# Patient Record
Sex: Female | Born: 1990 | Hispanic: No | Marital: Single | Smoking: Never smoker
Health system: Southern US, Community
[De-identification: ages and names within clinical notes are randomized; demographics above are authoritative.]

## PROBLEM LIST (undated history)

## (undated) DIAGNOSIS — F32A Depression, unspecified: Secondary | ICD-10-CM

## (undated) DIAGNOSIS — F419 Anxiety disorder, unspecified: Secondary | ICD-10-CM

## (undated) DIAGNOSIS — G43909 Migraine, unspecified, not intractable, without status migrainosus: Secondary | ICD-10-CM

## (undated) DIAGNOSIS — F329 Major depressive disorder, single episode, unspecified: Secondary | ICD-10-CM

---

## 2015-08-01 ENCOUNTER — Emergency Department (INDEPENDENT_AMBULATORY_CARE_PROVIDER_SITE_OTHER)
Admission: EM | Admit: 2015-08-01 | Discharge: 2015-08-01 | Disposition: A | Discharge status: Home / Self Care | Payer: BLUE CROSS/BLUE SHIELD | Source: Home / Self Care | Attending: Family Medicine | Admitting: Family Medicine

## 2015-08-01 ENCOUNTER — Encounter (HOSPITAL_COMMUNITY): Payer: Self-pay | Admitting: Emergency Medicine

## 2015-08-01 DIAGNOSIS — J069 Acute upper respiratory infection, unspecified: Secondary | ICD-10-CM | POA: Diagnosis not present

## 2015-08-01 HISTORY — DX: Migraine, unspecified, not intractable, without status migrainosus: G43.909

## 2015-08-01 MED ORDER — IPRATROPIUM BROMIDE 0.06 % NA SOLN: 2.0000 | Freq: Four times a day (QID) | NASAL | Status: AC

## 2015-08-01 NOTE — ED Notes (Signed)
Productive cough, runny nose, sore throat. PT denies fever and body aches.   Taken mucinex and cold/flu OTC with no relief.

## 2015-08-01 NOTE — ED Provider Notes (Signed)
CSN: [id number]     Arrival date & time 08/01/15  1321 History   First MD Initiated Contact with Patient 08/01/15 1423     Chief Complaint  Patient presents with  . Cough   (Consider location/radiation/quality/duration/timing/severity/associated sxs/prior Treatment) Patient is a 25 y.o. female presenting with cough. The history is provided by the patient.  Cough Cough characteristics:  Productive and hacking Sputum characteristics:  Yellow Severity:  Mild Onset quality:  Gradual Duration:  1 week Progression:  Unchanged Chronicity:  New Smoker: no   Context: upper respiratory infection   Ineffective treatments:  Cough suppressants Associated symptoms: rhinorrhea and sinus congestion   Associated symptoms: no chills, no fever, no myalgias, no shortness of breath, no sore throat and no wheezing     Past Medical History  Diagnosis Date  . Migraines    History reviewed. No pertinent past surgical history. History reviewed. No pertinent family history. Social History  Substance Use Topics  . Smoking status: Never Smoker   . Smokeless tobacco: None  . Alcohol Use: Yes     Comment: occasional   OB History    Obstetric Comments   LMP 2 weeks ago     Review of Systems  Constitutional: Negative.  Negative for fever and chills.  HENT: Positive for congestion, postnasal drip and rhinorrhea. Negative for sore throat.   Respiratory: Positive for cough. Negative for shortness of breath and wheezing.   Musculoskeletal: Negative for myalgias.  All other systems reviewed and are negative.   Allergies  Review of patient's allergies indicates no known allergies.  Home Medications   Prior to Admission medications   Medication Sig Start Date End Date Taking? Authorizing Provider  buPROPion (WELLBUTRIN) 100 MG tablet Take 25 mg by mouth daily.   Yes Historical Provider, MD  ipratropium (ATROVENT) 0.06 % nasal spray Place 2 sprays into both nostrils 4 (four) times daily. 08/01/15    Linna HoffJames D Libero Puthoff, MD   Meds Ordered and Administered this Visit  Medications - No data to display  BP 145/85 mmHg  Pulse 88  Temp(Src) 98.4 F (36.9 C) (Oral)  Resp 18  Ht 5\' 3"  (1.6 m)  Wt 120 lb (54.432 kg)  BMI 21.26 kg/m2  SpO2 100%  LMP 07/18/2015 No data found.   Physical Exam  Constitutional: She is oriented to person, place, and time. She appears well-developed and well-nourished.  HENT:  Right Ear: External ear normal.  Left Ear: External ear normal.  Mouth/Throat: Oropharynx is clear and moist.  Eyes: Pupils are equal, round, and reactive to light.  Neck: Normal range of motion. Neck supple.  Cardiovascular: Normal heart sounds.   Pulmonary/Chest: Effort normal and breath sounds normal.  Lymphadenopathy:    She has no cervical adenopathy.  Neurological: She is alert and oriented to person, place, and time.  Skin: Skin is warm and dry.  Nursing note and vitals reviewed.   ED Course  Procedures (including critical care time)  Labs Review Labs Reviewed - No data to display  Imaging Review No results found.   Visual Acuity Review  Right Eye Distance:   Left Eye Distance:   Bilateral Distance:    Right Eye Near:   Left Eye Near:    Bilateral Near:         MDM   1. URI (upper respiratory infection)        Linna HoffJames D Nyiah Pianka, MD 08/01/15 [phone]

## 2015-08-01 NOTE — Discharge Instructions (Signed)
Drink plenty of fluids as discussed, use medicine as prescribed, and mucinex or delsym for cough. Return or see your doctor if further problems °

## 2015-10-10 ENCOUNTER — Ambulatory Visit: Payer: BLUE CROSS/BLUE SHIELD | Admitting: Licensed Clinical Social Worker

## 2016-08-31 ENCOUNTER — Encounter (HOSPITAL_COMMUNITY): Payer: Self-pay | Admitting: Emergency Medicine

## 2016-08-31 ENCOUNTER — Emergency Department (HOSPITAL_COMMUNITY)
Admission: EM | Admit: 2016-08-31 | Discharge: 2016-08-31 | Disposition: A | Discharge status: Home / Self Care | Payer: BLUE CROSS/BLUE SHIELD | Attending: Emergency Medicine | Admitting: Emergency Medicine

## 2016-08-31 DIAGNOSIS — G43909 Migraine, unspecified, not intractable, without status migrainosus: Secondary | ICD-10-CM | POA: Diagnosis present

## 2016-08-31 DIAGNOSIS — G43809 Other migraine, not intractable, without status migrainosus: Secondary | ICD-10-CM | POA: Insufficient documentation

## 2016-08-31 HISTORY — DX: Anxiety disorder, unspecified: F41.9

## 2016-08-31 HISTORY — DX: Major depressive disorder, single episode, unspecified: F32.9

## 2016-08-31 HISTORY — DX: Depression, unspecified: F32.A

## 2016-08-31 MED ORDER — PROCHLORPERAZINE MALEATE 5 MG PO TABS
10.0000 mg | ORAL_TABLET | Freq: Once | ORAL | Status: AC
  Administered 2016-08-31: 10 mg via ORAL
  Filled 2016-08-31: qty 2

## 2016-08-31 MED ORDER — ACETAMINOPHEN 325 MG PO TABS
650.0000 mg | ORAL_TABLET | Freq: Once | ORAL | Status: AC
  Administered 2016-08-31: 650 mg via ORAL
  Filled 2016-08-31: qty 2

## 2016-08-31 MED ORDER — IBUPROFEN 800 MG PO TABS
800.0000 mg | ORAL_TABLET | Freq: Once | ORAL | Status: AC
  Administered 2016-08-31: 800 mg via ORAL
  Filled 2016-08-31: qty 1

## 2016-08-31 NOTE — ED Provider Notes (Signed)
[facility]-EMERGENCY DEPT Provider Note   CSN: [id number] Arrival date & time: 08/31/16  0554     History   Chief Complaint Chief Complaint  Patient presents with  . Migraine    HPI Diana Hoover is a 26 y.o. female.  HPI  26 year old female presents today complaining of migraine headache for 22 hours. She states that his is diffuse in nature. She has had some nausea but no vomiting. It is somewhat similar to previous migraine headaches which was scribed by Leodis Liverpool every week but has not had one for several months. She normally takes acetaminophen and ibuprofen. She took DayQuil last night. This morning she continues to have a headache. She has not taken anything today. She has also had some URI symptoms and some body aches. She has not had fever, chills, vomiting, or diarrhea. She began her menstrual cycle yesterday. She works at [employer] and did not work last night.  Past Medical History:  Diagnosis Date  . Anxiety   . Depression   . Migraines     There are no active problems to display for this patient.   History reviewed. No pertinent surgical history.  OB History    Obstetric Comments   LMP 2 weeks ago       Home Medications    Prior to Admission medications   Medication Sig Start Date End Date Taking? Authorizing Provider  DM-Phenylephrine-Acetaminophen (VICKS DAYQUIL COLD & FLU) 10-5-325 MG/15ML LIQD Take 15 mLs by mouth as needed (for coug).   Yes Historical Provider, MD  ipratropium (ATROVENT) 0.06 % nasal spray Place 2 sprays into both nostrils 4 (four) times daily. Patient not taking: Reported on 08/31/2016 08/01/15   Linna Hoff, MD    Family History No family history on file.  Social History Social History  Substance Use Topics  . Smoking status: Never Smoker  . Smokeless tobacco: Never Used  . Alcohol use Yes     Comment: occasional     Allergies   Patient has no known allergies.   Review of Systems Review of Systems  Constitutional:  Negative for chills and fever.  HENT: Positive for congestion.   Eyes: Negative.   Respiratory: Negative.   Cardiovascular: Negative.   Gastrointestinal: Negative.   Endocrine: Negative.   Genitourinary: Negative.   Musculoskeletal: Negative.   Neurological: Positive for headaches.  Hematological: Negative.   Psychiatric/Behavioral: Negative.   All other systems reviewed and are negative.    Physical Exam Updated Vital Signs BP 121/83 (BP Location: Right Arm)   Pulse (!) 115   Temp 99.9 F (37.7 C) (Oral)   Resp 18   Ht  (1.6 m)   Wt 54.4 kg   LMP 08/30/2016 (Exact Date)   SpO2 100%   BMI 21.26 kg/m   Physical Exam  Constitutional: She is oriented to person, place, and time. She appears well-developed and well-nourished. No distress.  HENT:  Head: Normocephalic and atraumatic.  Right Ear: External ear normal.  Left Ear: External ear normal.  Nose: Nose normal.  Eyes: Conjunctivae and EOM are normal. Pupils are equal, round, and reactive to light.  Neck: Normal range of motion. Neck supple.  Cardiovascular: Normal rate.   Pulmonary/Chest: Effort normal.  Abdominal: Soft.  Musculoskeletal: Normal range of motion.  Neurological: She is alert and oriented to person, place, and time. No cranial nerve deficit. She exhibits normal muscle tone. Coordination normal.  Skin: Skin is warm and dry.  Psychiatric: She has a normal mood  and affect. Her behavior is normal. Thought content normal.  Nursing note and vitals reviewed.    ED Treatments / Results  Labs (all labs ordered are listed, but only abnormal results are displayed) Labs Reviewed - No data to display  EKG  EKG Interpretation None       Radiology No results found.  Procedures Procedures (including critical care time)  Medications Ordered in ED Medications  ibuprofen (ADVIL,MOTRIN) tablet 800 mg (not administered)  acetaminophen (TYLENOL) tablet 650 mg (not administered)  prochlorperazine  (COMPAZINE) tablet 10 mg (not administered)     Initial Impression / Assessment and Plan / ED Course  I have reviewed the triage vital signs and the nursing notes.  Pertinent labs & imaging results that were available during my care of the patient were reviewed by me and considered in my medical decision making (see chart for details).      Final Clinical Impressions(s) / ED Diagnoses   Final diagnoses:  Other migraine without status migrainosus, not intractable    New Prescriptions New Prescriptions   No medications on file     Margarita Grizzle, MD 08/31/16 [phone]

## 2016-08-31 NOTE — ED Triage Notes (Signed)
Pt reports a migraine going on for 21 hours now. Pt reports a history of same but does not have any prescription medications. Pt reports this migraine is different from the others in that she normally gets pain behind her right eye but this one the pain is all over her head.

## 2017-05-02 ENCOUNTER — Emergency Department (HOSPITAL_COMMUNITY)
Admission: EM | Admit: 2017-05-02 | Discharge: 2017-05-02 | Disposition: A | Discharge status: Home / Self Care | Payer: BLUE CROSS/BLUE SHIELD | Attending: Emergency Medicine | Admitting: Emergency Medicine

## 2017-05-02 ENCOUNTER — Encounter (HOSPITAL_COMMUNITY): Payer: Self-pay | Admitting: Emergency Medicine

## 2017-05-02 DIAGNOSIS — G43009 Migraine without aura, not intractable, without status migrainosus: Secondary | ICD-10-CM

## 2017-05-02 DIAGNOSIS — G43909 Migraine, unspecified, not intractable, without status migrainosus: Secondary | ICD-10-CM | POA: Insufficient documentation

## 2017-05-02 MED ORDER — KETOROLAC TROMETHAMINE 30 MG/ML IJ SOLN
30.0000 mg | Freq: Once | INTRAMUSCULAR | Status: AC
  Administered 2017-05-02: 30 mg via INTRAMUSCULAR
  Filled 2017-05-02: qty 1

## 2017-05-02 MED ORDER — DIPHENHYDRAMINE HCL 25 MG PO CAPS
25.0000 mg | ORAL_CAPSULE | Freq: Once | ORAL | Status: AC
  Administered 2017-05-02: 25 mg via ORAL
  Filled 2017-05-02: qty 1

## 2017-05-02 MED ORDER — METOCLOPRAMIDE HCL 10 MG PO TABS
5.0000 mg | ORAL_TABLET | Freq: Once | ORAL | Status: AC
  Administered 2017-05-02: 5 mg via ORAL
  Filled 2017-05-02: qty 1

## 2017-05-02 MED ORDER — KETOROLAC TROMETHAMINE 10 MG PO TABS: 10.0000 mg | ORAL_TABLET | Freq: Four times a day (QID) | ORAL | 0 refills | Status: AC | PRN

## 2017-05-02 NOTE — ED Provider Notes (Signed)
[facility]-EMERGENCY DEPT Provider Note   CSN: [id number] Arrival date & time: 05/02/17  1456     History   Chief Complaint Chief Complaint  Patient presents with  . Migraine    HPI Diana Hoover is a 26 y.o. female.  HPI   26 year old female presents today with complaints of migraine.  Patient has a significant past medical history of the same.  She notes last night developing a pain in her right forehead and behind her eye.  This radiates back to her neck.  She reports this is identical to previous migraines, not improved with over-the-counter medication including Excedrin.  Patient denies any neurological deficits.  Patient reports she has had upper respiratory congestion over the last week and thinks that this has triggered it.  She denies any fever, cough, shortness of breath, any signs of serious infection.  Patient denies any other complaints today.   Past Medical History:  Diagnosis Date  . Anxiety   . Depression   . Migraines     There are no active problems to display for this patient.   History reviewed. No pertinent surgical history.  OB History    Obstetric Comments   LMP 2 weeks ago       Home Medications    Prior to Admission medications   Medication Sig Start Date End Date Taking? Authorizing Provider  DM-Phenylephrine-Acetaminophen (VICKS DAYQUIL COLD & FLU) 10-5-325 MG/15ML LIQD Take 15 mLs by mouth as needed (for coug).    [provider]  ipratropium (ATROVENT) 0.06 % nasal spray Place 2 sprays into both nostrils 4 (four) times daily. Patient not taking: Reported on 08/31/2016 08/01/15   Linna HoffKindl, James D, MD  ketorolac (TORADOL) 10 MG tablet Take 1 tablet (10 mg total) by mouth every 6 (six) hours as needed. 05/02/17   Eyvonne MechanicHedges, Luanna Weesner, PA-C    Family History No family history on file.  Social History Social History   Tobacco Use  . Smoking status: Never Smoker  . Smokeless tobacco: Never Used  Substance Use  Topics  . Alcohol use: Yes    Comment: occasional  . Drug use: No     Allergies   Patient has no known allergies.   Review of Systems Review of Systems  All other systems reviewed and are negative.    Physical Exam Updated Vital Signs BP 128/82 (BP Location: Left Arm)   Pulse 97   Resp 14   SpO2 100%   Physical Exam  Constitutional: She is oriented to person, place, and time. She appears well-developed and well-nourished.  HENT:  Head: Normocephalic and atraumatic.  Eyes: Conjunctivae are normal. Pupils are equal, round, and reactive to light. Right eye exhibits no discharge. Left eye exhibits no discharge. No scleral icterus.  Neck: Normal range of motion. No JVD present. No tracheal deviation present.  Pulmonary/Chest: Effort normal. No stridor.  Neurological: She is alert and oriented to person, place, and time. No cranial nerve deficit or sensory deficit. She exhibits normal muscle tone. Coordination normal.  Psychiatric: She has a normal mood and affect. Her behavior is normal. Judgment and thought content normal.  Nursing note and vitals reviewed.    ED Treatments / Results  Labs (all labs ordered are listed, but only abnormal results are displayed) Labs Reviewed - No data to display  EKG  EKG Interpretation None       Radiology No results found.  Procedures Procedures (including critical care time)  Medications Ordered in ED  Medications  ketorolac (TORADOL) 30 MG/ML injection 30 mg (30 mg Intramuscular Given 05/02/17 1828)  metoCLOPramide (REGLAN) tablet 5 mg (5 mg Oral Given 05/02/17 1827)  diphenhydrAMINE (BENADRYL) capsule 25 mg (25 mg Oral Given 05/02/17 1827)     Initial Impression / Assessment and Plan / ED Course  I have reviewed the triage vital signs and the nursing notes.  Pertinent labs & imaging results that were available during my care of the patient were reviewed by me and considered in my medical decision making (see chart for  details).      Final Clinical Impressions(s) / ED Diagnoses   Final diagnoses:  Migraine without aura and without status migrainosus, not intractable    Labs:   Imaging:  Consults:  Therapeutics: Toradol, Reglan, Benadryl  Discharge Meds: toradol   Assessment/Plan: 26 year old female presents today with complaints of migraine.  Patient without neurological deficits, symptoms improved with Toradol Reglan Benadryl.. Migraine typical of previous.  Patient will be given a short course of Toradol as needed for her migraines.  She is instructed to follow-up with neurology, return with any new or worsening signs or symptoms.  Patient without history of GI bleed or any other past medical history.  Patient verbalized understanding and agreement to today's plan had no further questions or concerns at the time of discharge.      ED Discharge Orders        Ordered    ketorolac (TORADOL) 10 MG tablet  Every 6 hours PRN     05/02/17 1930       Rosalio LoudHedges, Kaleena Corrow, PA-C 05/02/17 1932    Bethann BerkshireZammit, Joseph, MD 05/02/17 2242

## 2017-05-02 NOTE — ED Triage Notes (Signed)
Patient here with complaints of migraine that started 16 hours ago. Nausea, no vomiting. OTC medications with no relief. Hx of same.

## 2017-05-02 NOTE — Discharge Instructions (Signed)
Please read attached information. If you experience any new or worsening signs or symptoms please return to the emergency room for evaluation. Please follow-up with your primary care provider or specialist as discussed. Please use medication prescribed only as directed and discontinue taking if you have any concerning signs or symptoms.   °

## 2020-02-26 ENCOUNTER — Other Ambulatory Visit: Payer: Self-pay

## 2020-02-26 ENCOUNTER — Emergency Department (HOSPITAL_COMMUNITY)
Admission: EM | Admit: 2020-02-26 | Discharge: 2020-02-26 | Disposition: A | Discharge status: Home / Self Care | Payer: BC Managed Care – PPO | Attending: Emergency Medicine | Admitting: Emergency Medicine

## 2020-02-26 ENCOUNTER — Encounter (HOSPITAL_COMMUNITY): Payer: Self-pay

## 2020-02-26 ENCOUNTER — Emergency Department (HOSPITAL_COMMUNITY): Payer: BC Managed Care – PPO

## 2020-02-26 DIAGNOSIS — W01198A Fall on same level from slipping, tripping and stumbling with subsequent striking against other object, initial encounter: Secondary | ICD-10-CM | POA: Insufficient documentation

## 2020-02-26 DIAGNOSIS — Y92009 Unspecified place in unspecified non-institutional (private) residence as the place of occurrence of the external cause: Secondary | ICD-10-CM | POA: Insufficient documentation

## 2020-02-26 DIAGNOSIS — S0992XA Unspecified injury of nose, initial encounter: Secondary | ICD-10-CM

## 2020-02-26 NOTE — Discharge Instructions (Signed)
Take Tylenol and/or ibuprofen as needed for pain. Use ice to with pain and swelling. Try not to touch or pick at oyur nose, this may case recurrent bleeding. Follow-up with the ear nose and throat doctor listed below as needed for further evaluation. Return to the emergency room if you develop severe worsening headache, vision changes, slurred speech, vomiting, difficulty walking straight line, new, worsening, or concerning symptoms.

## 2020-02-26 NOTE — ED Triage Notes (Signed)
Patient complains of nose pain after tripping today and falling on face and striking nose. Patient denies loc. Bleeding intermittently through left nare, swelling noted to nose. Patient in no distress

## 2020-02-26 NOTE — ED Notes (Signed)
The pt is c/o a possible broken nose  She tripped over her baggy pajamas this am and she fell face forward onto her vinyl floor  Striking her nose.  It bled initially small scratch to her nose  No bleeding at present aand o x 4

## 2020-02-26 NOTE — ED Provider Notes (Signed)
[facility] EMERGENCY DEPARTMENT Provider Note   CSN: [id number] Arrival date & time: 02/26/20  1416     History No chief complaint on file.   Diana Hoover is a 29 y.o. female presenting for evaluation of nose pain and swelling.  Patient states around 2:00 this afternoon she fell when her pants got caught on something in the room, falling face first and hitting her head on the floor.  Since then, she has had pain mostly in her nose and her forehead.  She denies loss of consciousness.  She did have bleeding from her nose, but this resolved about an hour ago.  It resolved after direct pressure.  She has not taken anything for pain including Tylenol or ibuprofen.  She denies headache, vision changes, slurred speech, neck pain, back pain, numbness, tingling.  She has no medical problems, takes medications daily.  She is not on blood thinners.  HPI     Past Medical History:  Diagnosis Date  . Anxiety   . Depression   . Migraines     There are no problems to display for this patient.   History reviewed. No pertinent surgical history.   OB History   No obstetric history on file.    Obstetric Comments  LMP 2 weeks ago        No family history on file.  Social History   Tobacco Use  . Smoking status: Never Smoker  . Smokeless tobacco: Never Used  Substance Use Topics  . Alcohol use: Yes    Comment: occasional  . Drug use: No    Home Medications Prior to Admission medications   Medication Sig Start Date End Date Taking? Authorizing Provider  DM-Phenylephrine-Acetaminophen (VICKS DAYQUIL COLD & FLU) 10-5-325 MG/15ML LIQD Take 15 mLs by mouth as needed (for coug).    [provider]  ipratropium (ATROVENT) 0.06 % nasal spray Place 2 sprays into both nostrils 4 (four) times daily. Patient not taking: Reported on 08/31/2016 08/01/15   Linna Hoff, MD  ketorolac (TORADOL) 10 MG tablet Take 1 tablet (10 mg total) by mouth every 6 (six) hours as  needed. 05/02/17   Eyvonne Mechanic, PA-C    Allergies    Patient has no known allergies.  Review of Systems   Review of Systems  HENT: Positive for facial swelling (nose) and nosebleeds (resolved).   Neurological: Positive for headaches (frontal).  All other systems reviewed and are negative.   Physical Exam Updated Vital Signs BP 125/82   Pulse 88   Temp 98.5 F (36.9 C) (Oral)   Resp 18   SpO2 100%   Physical Exam Vitals and nursing note reviewed.  Constitutional:      General: She is not in acute distress.    Appearance: She is well-developed.     Comments: Sitting in the bed in no acute distress  HENT:     Head: Normocephalic.     Comments: Swelling of the bridge of the nose.  No nasal septal hematoma.  No trismus or malocclusion.  No tenderness palpation of the frontal head, no hematoma of the skull.  No hemotympanum. Eyes:     Conjunctiva/sclera: Conjunctivae normal.     Pupils: Pupils are equal, round, and reactive to light.  Neck:     Comments: No TTP of midline C-spine.  No step-offs or deformities.  Full active range of motion of neck without pain. Cardiovascular:     Rate and Rhythm: Normal rate and regular  rhythm.  Pulmonary:     Effort: Pulmonary effort is normal. No respiratory distress.     Breath sounds: Normal breath sounds. No wheezing.  Abdominal:     General: Bowel sounds are normal. There is no distension.     Palpations: Abdomen is soft.     Tenderness: There is no abdominal tenderness.  Musculoskeletal:        General: Normal range of motion.     Cervical back: Normal range of motion and neck supple.  Skin:    General: Skin is warm and dry.     Capillary Refill: Capillary refill takes less than 2 seconds.  Neurological:     Mental Status: She is alert and oriented to person, place, and time.     ED Results / Procedures / Treatments   Labs (all labs ordered are listed, but only abnormal results are displayed) Labs Reviewed - No data to  display  EKG None  Radiology DG Nasal Bones  Result Date: 02/26/2020 CLINICAL DATA:  Status post fall. EXAM: NASAL BONES - 3+ VIEW COMPARISON:  None. FINDINGS: There is no evidence of fracture or other bone abnormality. IMPRESSION: Negative. Electronically Signed   By: Aram Candela M.D.   On: 02/26/2020 15:20    Procedures Procedures (including critical care time)  Medications Ordered in ED Medications - No data to display  ED Course  I have reviewed the triage vital signs and the nursing notes.  Pertinent labs & imaging results that were available during my care of the patient were reviewed by me and considered in my medical decision making (see chart for details).    MDM Rules/Calculators/A&P                          Patient resenting for evaluation of nose pain and swelling after falling.  On exam, patient appears nontoxic.  X-rays obtained from triage read interpreted by me, no obvious fracture, however clinically patient has high risk for fracture.  Discussed treatment with pain control and ice, follow-up with ENT as needed.  Offered head CT due to frontal head pain, patient declined.  As I have low suspicion for intracranial bleed or skull fracture, I am agreeable with holding off on CT.  Discussed strict return precautions.  At this time, patient appears safe for discharge.  Patient states she understands and agrees to plan.   Final Clinical Impression(s) / ED Diagnoses Final diagnoses:  Injury of nose, initial encounter    Rx / DC Orders ED Discharge Orders    None       Alveria Apley, PA-C 02/26/20 1736    Virgina Norfolk, DO 02/26/20 [phone]

## 2021-04-22 IMAGING — CR DG NASAL BONES 3+V
3 series · 3 of 3 positions shown · non-contrast
Comparison: None.

CLINICAL DATA: Status post fall.

EXAM:
NASAL BONES - 3+ VIEW

[nasal waters]
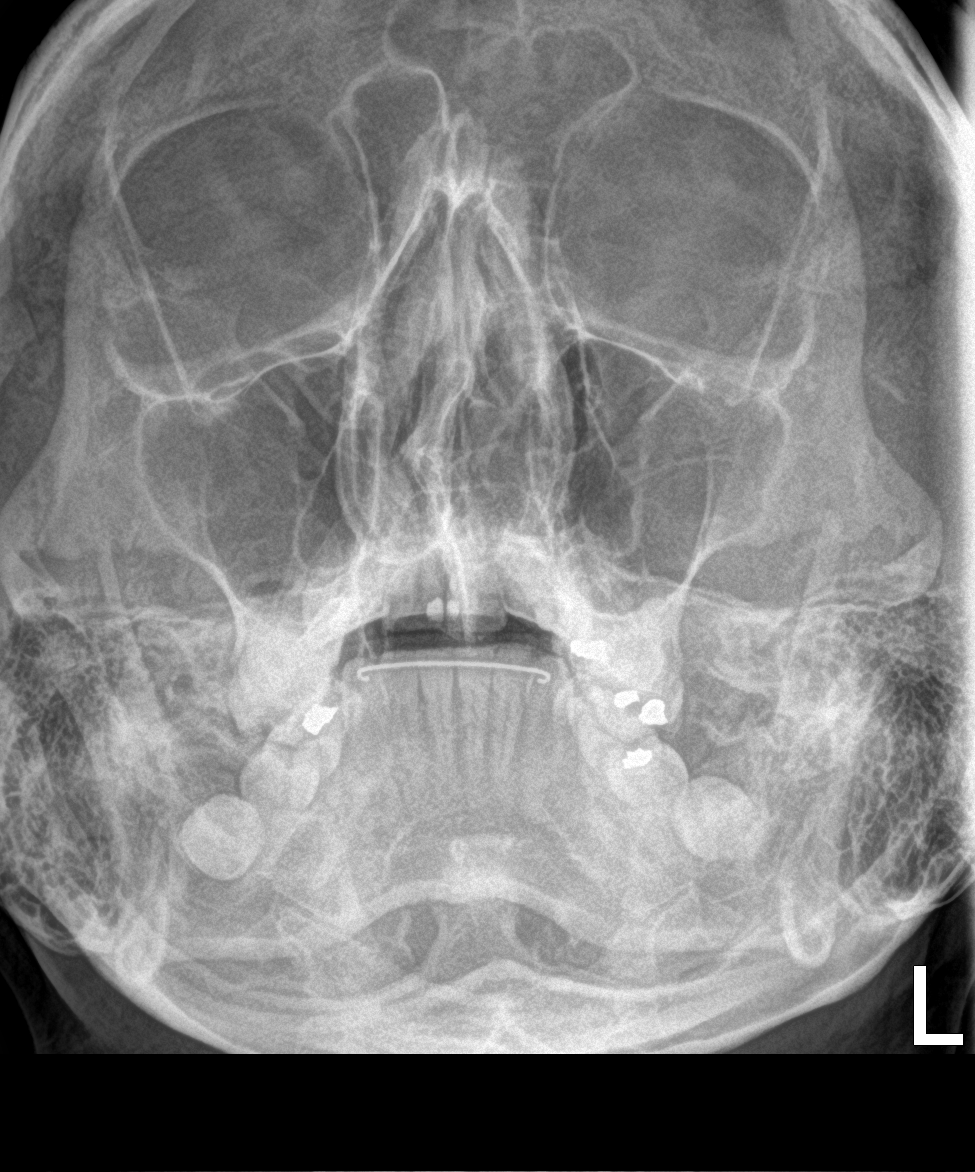

[nasal lat (1 of 2)]
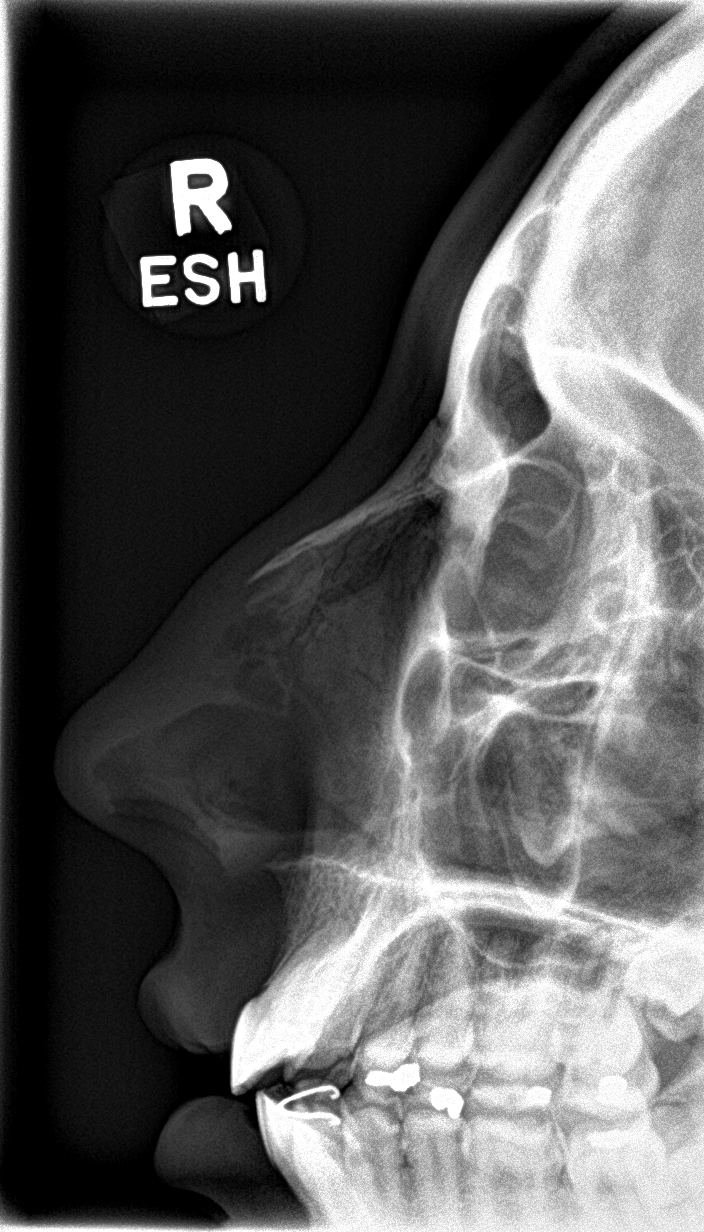

[nasal lat (2 of 2)]
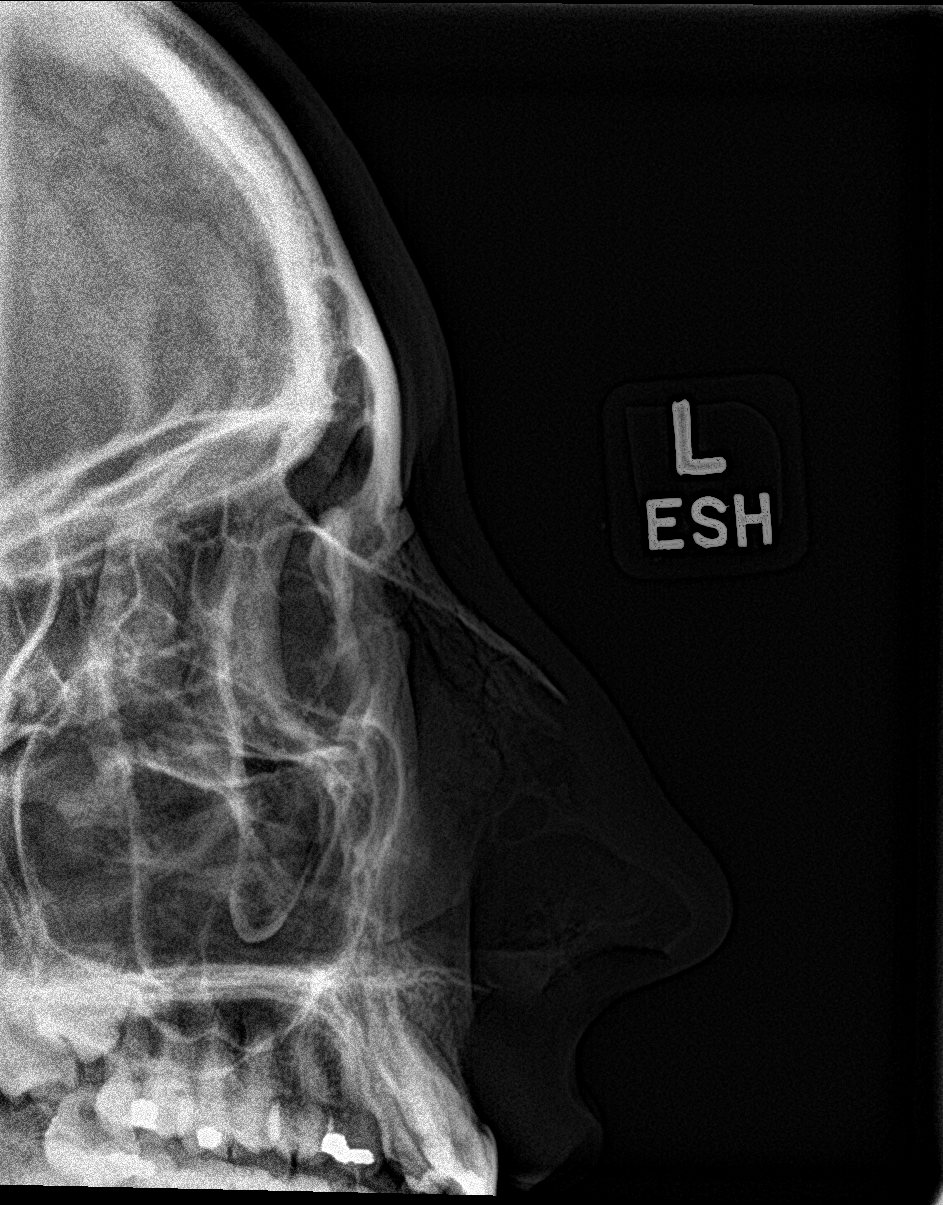

[3 of 3 positions shown; findings below may reference images not displayed]

FINDINGS: There is no evidence of fracture or other bone abnormality.
IMPRESSION: Negative.
